# Patient Record
Sex: Male | Born: 2000 | Race: White | Hispanic: No | Marital: Single | Smoking: Never smoker
Health system: Southern US, Community
[De-identification: ages and names within clinical notes are randomized; demographics above are authoritative.]

## PROBLEM LIST (undated history)

## (undated) HISTORY — PX: OTHER SURGICAL HISTORY: SHX169

## (undated) HISTORY — PX: KNEE ARTHROCENTESIS: SUR44

---
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
## 2020-03-21 DIAGNOSIS — Y999 Unspecified external cause status: Secondary | ICD-10-CM | POA: Diagnosis not present

## 2020-03-21 DIAGNOSIS — Z20822 Contact with and (suspected) exposure to covid-19: Secondary | ICD-10-CM | POA: Diagnosis not present

## 2020-03-21 DIAGNOSIS — R6884 Jaw pain: Secondary | ICD-10-CM | POA: Diagnosis not present

## 2020-03-21 DIAGNOSIS — J020 Streptococcal pharyngitis: Secondary | ICD-10-CM | POA: Diagnosis not present

## 2020-03-21 DIAGNOSIS — S161XXA Strain of muscle, fascia and tendon at neck level, initial encounter: Secondary | ICD-10-CM | POA: Diagnosis not present

## 2020-03-21 DIAGNOSIS — Z041 Encounter for examination and observation following transport accident: Secondary | ICD-10-CM | POA: Diagnosis not present

## 2020-03-21 DIAGNOSIS — S0083XA Contusion of other part of head, initial encounter: Secondary | ICD-10-CM | POA: Diagnosis not present

## 2020-03-23 IMAGING — MR MR KNEE*L* W/O CM
6 series · 38 of 40 positions shown · non-contrast
Comparison: None.

Addendum:
CLINICAL DATA: Catching of the left knee with pain more so
laterally x6 weeks.

EXAM:
MRI OF THE LEFT KNEE WITHOUT CONTRAST
TECHNIQUE: Multiplanar, multisequence MR imaging of the knee was performed. No
intravenous contrast was administered.

[Series 6: T2 fat-sat · axial · left · 4.0mm · 0.50mm/px · z∈[-92,+61]mm · 8 of 36 slices shown (1 of 3)]
[im 1/36]
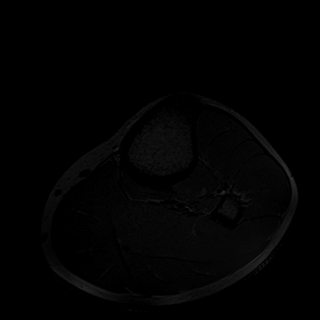
[im 6/36]
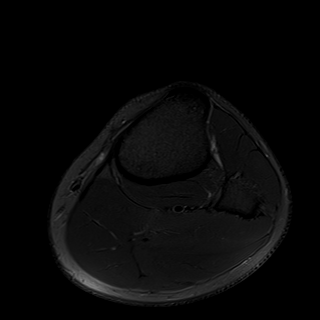
[im 11/36]
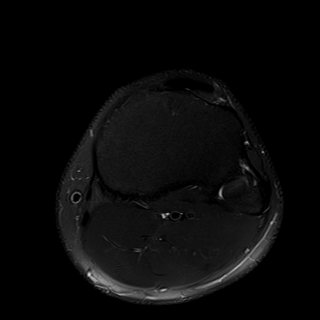
[im 16/36]
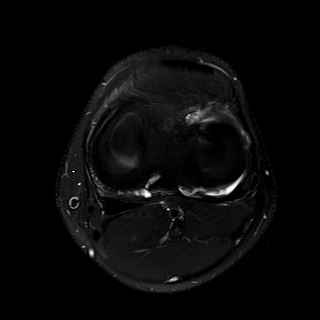
[im 21/36]
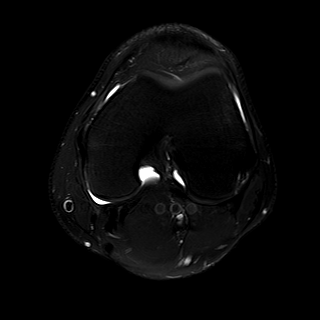
[im 26/36]
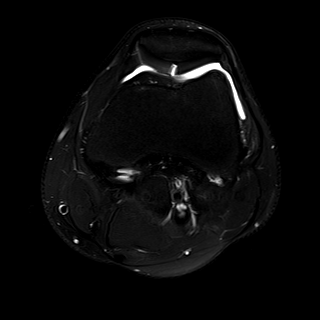
[im 31/36]
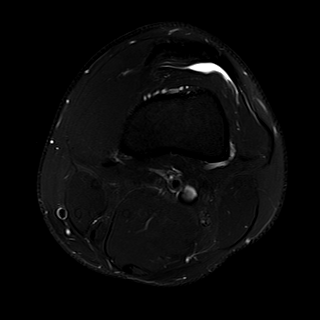
[im 36/36]
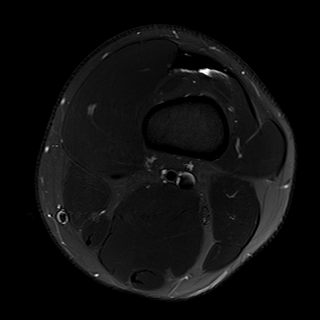

[Series 7: T2 fat-sat · coronal · left · 4.0mm · 0.39mm/px · 6 of 31 slices shown (2 of 3)]
[im 1/31]
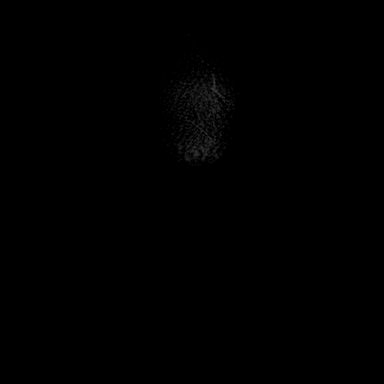
[im 7/31]
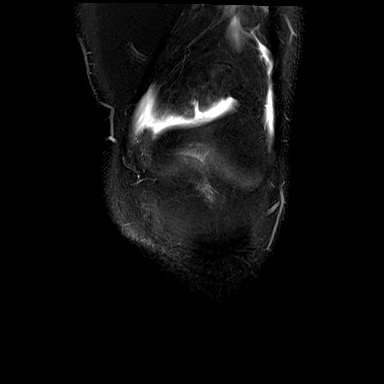
[im 13/31]
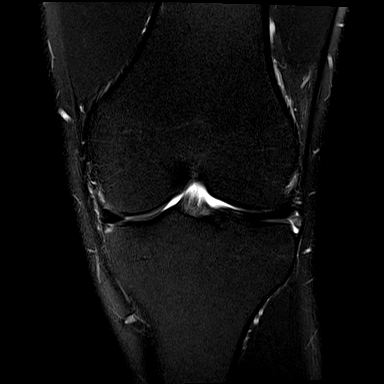
[im 19/31]
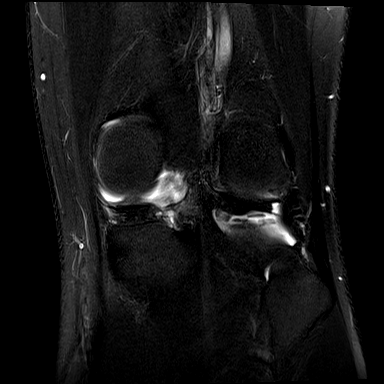
[im 25/31]
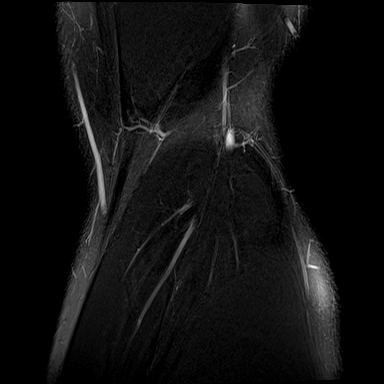
[im 31/31]
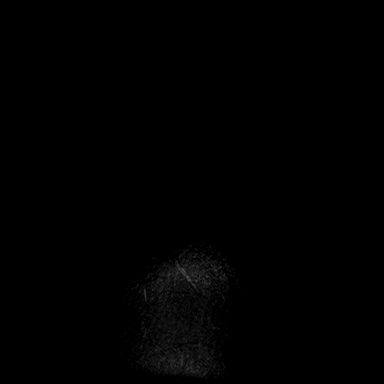

[Series 8: T1 · coronal · left · 4.0mm · 0.39mm/px · 4 of 31 slices shown]
[im 1/31]
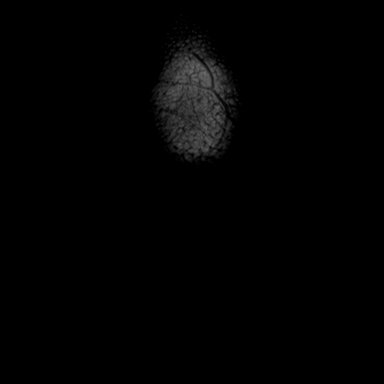
[im 7/31]
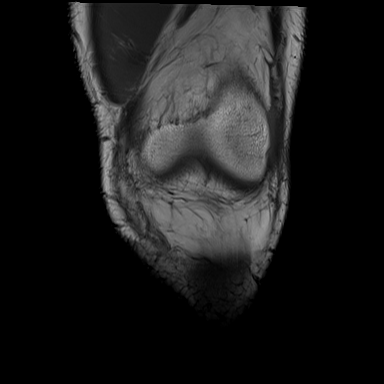
[im 13/31]
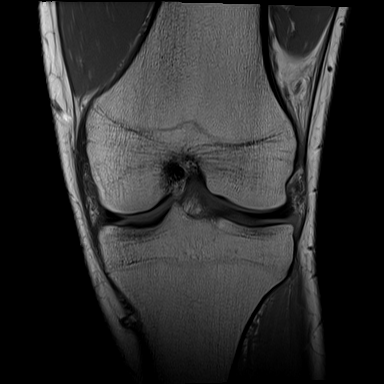
[im 19/31]
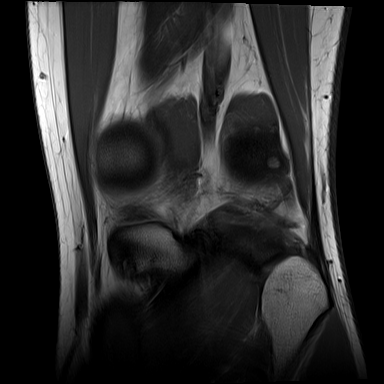

[Series 9: PD fat-sat · sagittal · left · 3.0mm · 0.39mm/px · 6 of 29 slices shown (1 of 2)]
[im 1/29]
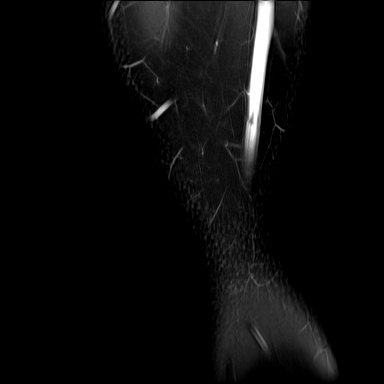
[im 6/29]
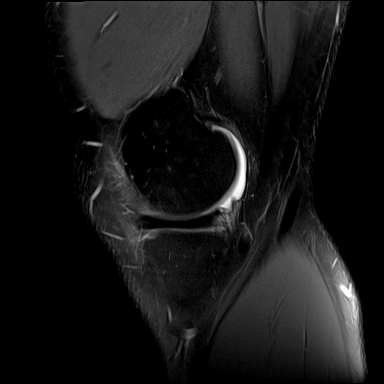
[im 12/29]
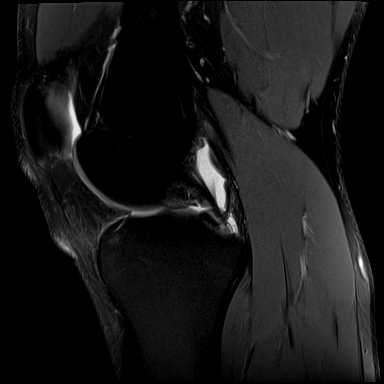
[im 17/29]
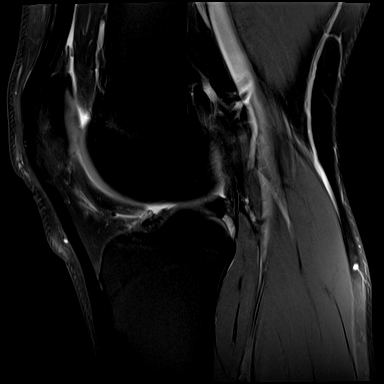
[im 23/29]
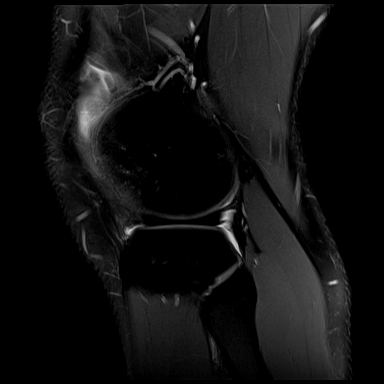
[im 29/29]
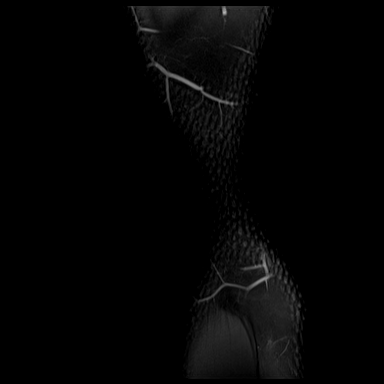

[Series 10: PD fat-sat · coronal · left · 3.0mm · 0.47mm/px · 8 of 39 slices shown (2 of 2)]
[im 1/39]
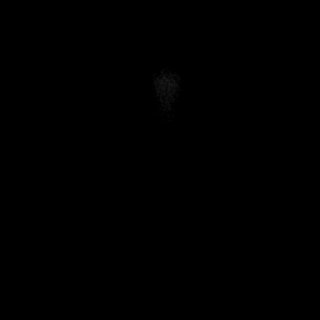
[im 6/39]
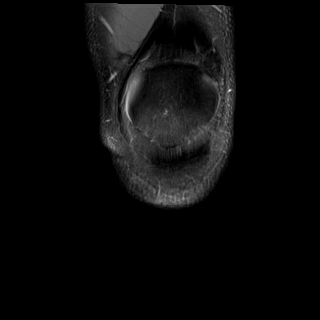
[im 11/39]
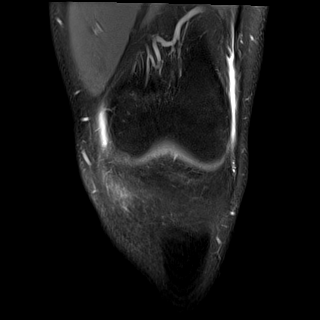
[im 17/39]
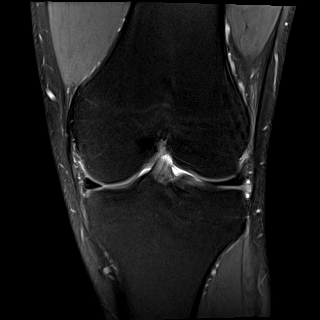
[im 22/39]
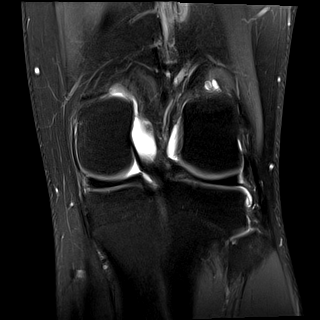
[im 28/39]
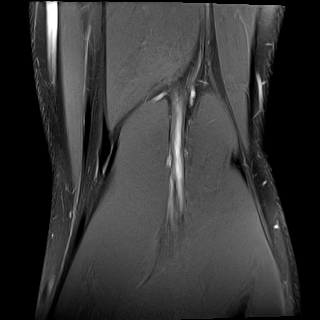
[im 33/39]
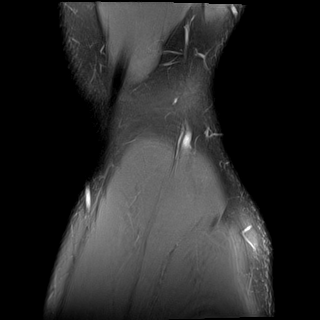
[im 39/39]
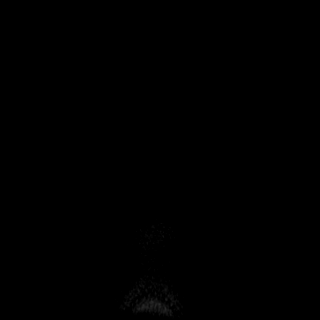

[Series 11: T2 fat-sat · sagittal · left · 3.0mm · 0.39mm/px · 6 of 29 slices shown (3 of 3)]
[im 1/29]
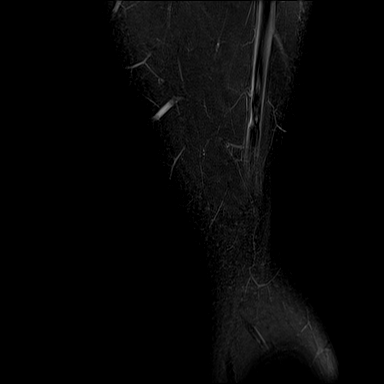
[im 6/29]
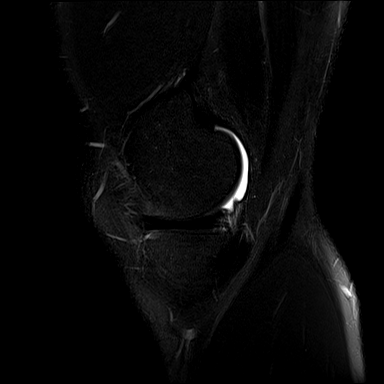
[im 12/29]
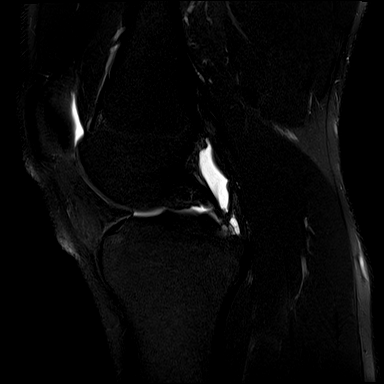
[im 17/29]
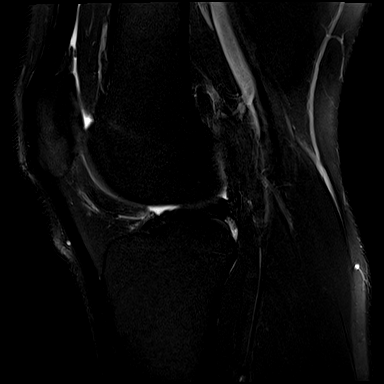
[im 23/29]
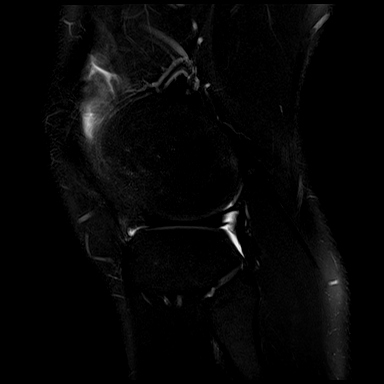
[im 29/29]
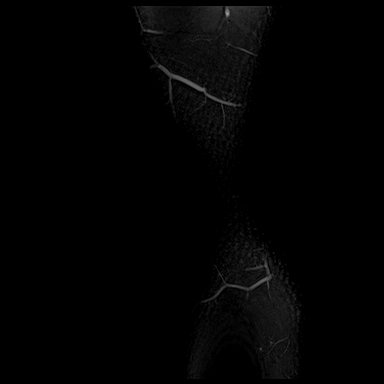

[38 of 40 positions shown; findings below may reference images not displayed]

FINDINGS: MENISCI

Medial meniscus:  Intact.

Lateral meniscus:  Intact.

LIGAMENTS

Cruciates:  Intact ACL and PCL.

Collaterals: Medial collateral ligament is intact. Lateral
collateral ligament complex is intact.

CARTILAGE

Patellofemoral: Full-thickness focal cartilaginous defect overlying
the median ridge of the patella, series [DATE] measuring 6 x 5 x 3 mm
in craniocaudad by AP by transverse.

Medial:  Intact

Lateral:  Intact

Joint: Trace joint effusion. Intact Hoffa's fat pad. No plical
thickening.

Popliteal Fossa:  No Baker cyst. Intact popliteus tendon.

Extensor Mechanism:  Intact quadriceps tendon and patellar tendon.

Bones: No focal marrow signal abnormality. No fracture or
dislocation.

Other: None
IMPRESSION: 1. Full-thickness cartilaginous defect of the patella overlying the
median ridge of the patella measuring 6 x 5 x 3 mm (CC x AP by
transverse).
2. Intact cruciate and collateral ligaments.
3. Intact menisci.

ADDENDUM:
The original report was by Dr. Mekere Bertelsen. The following addendum is
by Dr. Voss Sahl:

A request was received for assessment of the tibial
tubercle-trochlear groove distance.

The TT-TG distance is 0.6 cm. No abnormality of the patellar
retinacula or medial patellofemoral ligament identified.

*** End of Addendum ***

## 2020-03-29 DIAGNOSIS — Z1152 Encounter for screening for COVID-19: Secondary | ICD-10-CM | POA: Diagnosis not present

## 2020-03-29 DIAGNOSIS — Z20822 Contact with and (suspected) exposure to covid-19: Secondary | ICD-10-CM | POA: Diagnosis not present

## 2020-05-24 DIAGNOSIS — F4322 Adjustment disorder with anxiety: Secondary | ICD-10-CM | POA: Diagnosis not present

## 2020-10-03 DIAGNOSIS — H6641 Suppurative otitis media, unspecified, right ear: Secondary | ICD-10-CM | POA: Diagnosis not present

## 2020-10-03 DIAGNOSIS — Z7251 High risk heterosexual behavior: Secondary | ICD-10-CM | POA: Diagnosis not present

## 2020-10-03 DIAGNOSIS — J069 Acute upper respiratory infection, unspecified: Secondary | ICD-10-CM | POA: Diagnosis not present

## 2020-10-03 DIAGNOSIS — R634 Abnormal weight loss: Secondary | ICD-10-CM | POA: Diagnosis not present

## 2020-10-05 DIAGNOSIS — Z23 Encounter for immunization: Secondary | ICD-10-CM | POA: Diagnosis not present

## 2020-10-05 DIAGNOSIS — Z13220 Encounter for screening for lipoid disorders: Secondary | ICD-10-CM | POA: Diagnosis not present

## 2020-10-05 DIAGNOSIS — Z9189 Other specified personal risk factors, not elsewhere classified: Secondary | ICD-10-CM | POA: Diagnosis not present

## 2020-10-05 DIAGNOSIS — Z0000 Encounter for general adult medical examination without abnormal findings: Secondary | ICD-10-CM | POA: Diagnosis not present

## 2020-10-27 DIAGNOSIS — F4322 Adjustment disorder with anxiety: Secondary | ICD-10-CM | POA: Diagnosis not present

## 2022-01-30 DIAGNOSIS — D72829 Elevated white blood cell count, unspecified: Secondary | ICD-10-CM | POA: Diagnosis not present

## 2022-01-30 DIAGNOSIS — Z20822 Contact with and (suspected) exposure to covid-19: Secondary | ICD-10-CM | POA: Diagnosis not present

## 2022-01-30 DIAGNOSIS — R112 Nausea with vomiting, unspecified: Secondary | ICD-10-CM | POA: Diagnosis not present

## 2022-01-30 DIAGNOSIS — R109 Unspecified abdominal pain: Secondary | ICD-10-CM | POA: Diagnosis not present

## 2022-01-30 DIAGNOSIS — R1110 Vomiting, unspecified: Secondary | ICD-10-CM | POA: Diagnosis not present

## 2022-01-30 DIAGNOSIS — R197 Diarrhea, unspecified: Secondary | ICD-10-CM | POA: Diagnosis not present

## 2022-01-31 DIAGNOSIS — R112 Nausea with vomiting, unspecified: Secondary | ICD-10-CM | POA: Diagnosis not present

## 2022-01-31 DIAGNOSIS — F12988 Cannabis use, unspecified with other cannabis-induced disorder: Secondary | ICD-10-CM | POA: Diagnosis not present

## 2022-01-31 DIAGNOSIS — R109 Unspecified abdominal pain: Secondary | ICD-10-CM | POA: Diagnosis not present

## 2022-08-08 DIAGNOSIS — R1033 Periumbilical pain: Secondary | ICD-10-CM | POA: Diagnosis not present

## 2022-08-08 DIAGNOSIS — R10819 Abdominal tenderness, unspecified site: Secondary | ICD-10-CM | POA: Diagnosis not present

## 2022-08-08 DIAGNOSIS — R112 Nausea with vomiting, unspecified: Secondary | ICD-10-CM | POA: Diagnosis not present

## 2022-08-08 DIAGNOSIS — R7989 Other specified abnormal findings of blood chemistry: Secondary | ICD-10-CM | POA: Diagnosis not present

## 2022-08-08 DIAGNOSIS — R109 Unspecified abdominal pain: Secondary | ICD-10-CM | POA: Diagnosis not present

## 2022-08-08 DIAGNOSIS — R748 Abnormal levels of other serum enzymes: Secondary | ICD-10-CM | POA: Diagnosis not present

## 2022-08-08 DIAGNOSIS — F1290 Cannabis use, unspecified, uncomplicated: Secondary | ICD-10-CM | POA: Diagnosis not present

## 2022-08-08 DIAGNOSIS — F17200 Nicotine dependence, unspecified, uncomplicated: Secondary | ICD-10-CM | POA: Diagnosis not present

## 2022-08-09 DIAGNOSIS — R112 Nausea with vomiting, unspecified: Secondary | ICD-10-CM | POA: Diagnosis not present

## 2022-08-09 DIAGNOSIS — R7989 Other specified abnormal findings of blood chemistry: Secondary | ICD-10-CM | POA: Diagnosis not present

## 2022-08-14 DIAGNOSIS — D225 Melanocytic nevi of trunk: Secondary | ICD-10-CM | POA: Diagnosis not present

## 2022-08-14 DIAGNOSIS — Z7189 Other specified counseling: Secondary | ICD-10-CM | POA: Diagnosis not present

## 2022-08-14 DIAGNOSIS — D224 Melanocytic nevi of scalp and neck: Secondary | ICD-10-CM | POA: Diagnosis not present

## 2022-08-14 DIAGNOSIS — D492 Neoplasm of unspecified behavior of bone, soft tissue, and skin: Secondary | ICD-10-CM | POA: Diagnosis not present

## 2022-08-14 DIAGNOSIS — D229 Melanocytic nevi, unspecified: Secondary | ICD-10-CM | POA: Diagnosis not present

## 2023-01-15 DIAGNOSIS — R21 Rash and other nonspecific skin eruption: Secondary | ICD-10-CM | POA: Diagnosis not present

## 2023-08-15 ENCOUNTER — Encounter (HOSPITAL_BASED_OUTPATIENT_CLINIC_OR_DEPARTMENT_OTHER): Payer: Self-pay | Admitting: Orthopedic Surgery

## 2023-08-15 ENCOUNTER — Other Ambulatory Visit: Payer: Self-pay

## 2023-08-20 ENCOUNTER — Other Ambulatory Visit: Payer: Self-pay | Admitting: Orthopedic Surgery

## 2023-08-21 NOTE — H&P (Cosign Needed)
  Casey Harper is an 23 y.o. male.   Chief Complaint: Right forefoot pain HPI: Patient is a 23 year old male that presents to the OR today for definitive treatment for his painful right bunion deformity.  He has failed conservative treatment consisting of footwear modification and activity modification.  He elects for surgical correction of his painful deformity.  Allergies: No Known Allergies  History reviewed. No pertinent past medical history.  Past Surgical History:  Procedure Laterality Date   OTHER SURGICAL HISTORY     "Thumb surgery" by Dr. Amanda Pea    Family History: History reviewed. No pertinent family history.  Social History:   reports that he has never smoked. He has never used smokeless tobacco. He reports current alcohol use. He reports that he does not use drugs.  Medications: No medications prior to admission.    No results found for this or any previous visit (from the past 48 hours).  No results found.    Height 5\' 11"  (1.803 m), weight 77.1 kg.  PE:  well nourished and well developed.  NAD.  EOMI.  Resp unlabored.  Right foot bunion deformity.  Tender to palpation at median hypertrophic eminence.  Assessment/Plan Right bunion  Patient presents to the OR for definitive treatment of his painful right bunion deformity.  He will require right MIS bunion correction with double osteotomy.  After reviewing the procedure, postoperative protocol, and risk of surgery the patient elects for surgical intervention.  The patient specifically understands risks of bleeding, infection, nerve damage, blood clots, need for additional surgery, continued pain, nonunion, post traumatic arthritis, recurrence of deformity, amputation and death.   Alfredo Martinez PA-C [facility] Office:  [phone]   The patient has a h/o right foot painful bunion deformity that limits his activities and shoewear.  He presents today for surgical correction of this painful condition.  The risks  and benefits of the alternative treatment options have been discussed in detail.  The patient wishes to proceed with surgery and specifically understands risks of bleeding, infection, nerve damage, blood clots, need for additional surgery, amputation and death.

## 2023-08-22 ENCOUNTER — Other Ambulatory Visit: Payer: Self-pay

## 2023-08-22 ENCOUNTER — Encounter (HOSPITAL_BASED_OUTPATIENT_CLINIC_OR_DEPARTMENT_OTHER): Payer: Self-pay | Admitting: Orthopedic Surgery

## 2023-08-22 ENCOUNTER — Ambulatory Visit (HOSPITAL_BASED_OUTPATIENT_CLINIC_OR_DEPARTMENT_OTHER)

## 2023-08-22 ENCOUNTER — Ambulatory Visit (HOSPITAL_BASED_OUTPATIENT_CLINIC_OR_DEPARTMENT_OTHER)
Admission: RE | Admit: 2023-08-22 | Discharge: 2023-08-22 | Disposition: A | Discharge status: Home / Self Care | Payer: BLUE CROSS/BLUE SHIELD | Attending: Orthopedic Surgery | Admitting: Orthopedic Surgery

## 2023-08-22 ENCOUNTER — Ambulatory Visit (HOSPITAL_BASED_OUTPATIENT_CLINIC_OR_DEPARTMENT_OTHER): Admitting: Anesthesiology

## 2023-08-22 ENCOUNTER — Encounter (HOSPITAL_BASED_OUTPATIENT_CLINIC_OR_DEPARTMENT_OTHER)
Admission: RE | Disposition: A | Discharge status: Home / Self Care | Payer: Self-pay | Source: Home / Self Care | Attending: Orthopedic Surgery

## 2023-08-22 DIAGNOSIS — Z01818 Encounter for other preprocedural examination: Secondary | ICD-10-CM

## 2023-08-22 DIAGNOSIS — M21611 Bunion of right foot: Secondary | ICD-10-CM | POA: Diagnosis present

## 2023-08-22 HISTORY — PX: BUNIONECTOMY: SHX129

## 2023-08-22 SURGERY — BUNIONECTOMY
Anesthesia: General | Site: Foot | Laterality: Right

## 2023-08-22 MED ORDER — DEXAMETHASONE SODIUM PHOSPHATE 4 MG/ML IJ SOLN
INTRAMUSCULAR | Status: DC | PRN
  Administered 2023-08-22: 5 mg via INTRAVENOUS

## 2023-08-22 MED ORDER — FENTANYL CITRATE (PF) 100 MCG/2ML IJ SOLN
INTRAMUSCULAR | Status: AC
  Filled 2023-08-22: qty 2

## 2023-08-22 MED ORDER — OXYCODONE HCL 5 MG/5ML PO SOLN: 5.0000 mg | Freq: Once | ORAL | Status: DC | PRN

## 2023-08-22 MED ORDER — CEFAZOLIN SODIUM-DEXTROSE 2-4 GM/100ML-% IV SOLN
2.0000 g | INTRAVENOUS | Status: AC
  Administered 2023-08-22: 2 g via INTRAVENOUS

## 2023-08-22 MED ORDER — PROPOFOL 500 MG/50ML IV EMUL
INTRAVENOUS | Status: AC
  Filled 2023-08-22: qty 50

## 2023-08-22 MED ORDER — SENNA 8.6 MG PO TABS: 2.0000 | ORAL_TABLET | Freq: Two times a day (BID) | ORAL | 0 refills | Status: AC

## 2023-08-22 MED ORDER — FENTANYL CITRATE (PF) 100 MCG/2ML IJ SOLN: 25.0000 ug | INTRAMUSCULAR | Status: DC | PRN

## 2023-08-22 MED ORDER — CEFAZOLIN SODIUM-DEXTROSE 2-4 GM/100ML-% IV SOLN
INTRAVENOUS | Status: AC
  Filled 2023-08-22: qty 100

## 2023-08-22 MED ORDER — ACETAMINOPHEN 10 MG/ML IV SOLN: 1000.0000 mg | Freq: Once | INTRAVENOUS | Status: DC | PRN

## 2023-08-22 MED ORDER — PROPOFOL 500 MG/50ML IV EMUL
INTRAVENOUS | Status: DC | PRN
  Administered 2023-08-22: 50 ug/kg/min via INTRAVENOUS

## 2023-08-22 MED ORDER — ONDANSETRON HCL 4 MG/2ML IJ SOLN
4.0000 mg | Freq: Once | INTRAMUSCULAR | Status: AC | PRN
  Administered 2023-08-22: 4 mg via INTRAVENOUS

## 2023-08-22 MED ORDER — DEXAMETHASONE SODIUM PHOSPHATE 10 MG/ML IJ SOLN
INTRAMUSCULAR | Status: DC | PRN
  Administered 2023-08-22: 10 mg via INTRAVENOUS

## 2023-08-22 MED ORDER — FENTANYL CITRATE (PF) 100 MCG/2ML IJ SOLN
100.0000 ug | Freq: Once | INTRAMUSCULAR | Status: AC
  Administered 2023-08-22: 100 ug via INTRAVENOUS

## 2023-08-22 MED ORDER — BUPIVACAINE-EPINEPHRINE (PF) 0.5% -1:200000 IJ SOLN
INTRAMUSCULAR | Status: AC
  Filled 2023-08-22: qty 30

## 2023-08-22 MED ORDER — ROPIVACAINE HCL 5 MG/ML IJ SOLN
INTRAMUSCULAR | Status: DC | PRN
  Administered 2023-08-22: 30 mL via PERINEURAL

## 2023-08-22 MED ORDER — DOCUSATE SODIUM 100 MG PO CAPS: 100.0000 mg | ORAL_CAPSULE | Freq: Two times a day (BID) | ORAL | 0 refills | Status: AC

## 2023-08-22 MED ORDER — LACTATED RINGERS IV SOLN: INTRAVENOUS | Status: DC

## 2023-08-22 MED ORDER — SODIUM CHLORIDE 0.9 % IV SOLN
INTRAVENOUS | Status: AC | PRN
Start: 2023-08-22 — End: 2023-08-22
  Administered 2023-08-22: 100 mL

## 2023-08-22 MED ORDER — DEXAMETHASONE SODIUM PHOSPHATE 10 MG/ML IJ SOLN
INTRAMUSCULAR | Status: AC
  Filled 2023-08-22: qty 1

## 2023-08-22 MED ORDER — MIDAZOLAM HCL 2 MG/2ML IJ SOLN
INTRAMUSCULAR | Status: AC
  Filled 2023-08-22: qty 2

## 2023-08-22 MED ORDER — ONDANSETRON HCL 4 MG/2ML IJ SOLN
INTRAMUSCULAR | Status: AC
  Filled 2023-08-22: qty 2

## 2023-08-22 MED ORDER — OXYCODONE HCL 5 MG PO TABS: 5.0000 mg | ORAL_TABLET | Freq: Once | ORAL | Status: DC | PRN

## 2023-08-22 MED ORDER — OXYCODONE HCL 5 MG PO TABS: 5.0000 mg | ORAL_TABLET | Freq: Four times a day (QID) | ORAL | 0 refills | Status: AC | PRN

## 2023-08-22 MED ORDER — PROPOFOL 10 MG/ML IV BOLUS
INTRAVENOUS | Status: DC | PRN
  Administered 2023-08-22: 200 mg via INTRAVENOUS

## 2023-08-22 MED ORDER — MIDAZOLAM HCL 2 MG/2ML IJ SOLN
2.0000 mg | Freq: Once | INTRAMUSCULAR | Status: AC
  Administered 2023-08-22: 2 mg via INTRAVENOUS

## 2023-08-22 MED ORDER — LIDOCAINE 2% (20 MG/ML) 5 ML SYRINGE
INTRAMUSCULAR | Status: DC | PRN
  Administered 2023-08-22: 20 mg via INTRAVENOUS

## 2023-08-22 MED ORDER — LIDOCAINE 2% (20 MG/ML) 5 ML SYRINGE
INTRAMUSCULAR | Status: AC
  Filled 2023-08-22: qty 5

## 2023-08-22 MED ORDER — VANCOMYCIN HCL 500 MG IV SOLR
INTRAVENOUS | Status: AC
  Filled 2023-08-22: qty 10

## 2023-08-22 MED ORDER — PROPOFOL 10 MG/ML IV BOLUS
INTRAVENOUS | Status: AC
  Filled 2023-08-22: qty 20

## 2023-08-22 MED ORDER — 0.9 % SODIUM CHLORIDE (POUR BTL) OPTIME
TOPICAL | Status: DC | PRN
  Administered 2023-08-22: 1000 mL

## 2023-08-22 MED ORDER — CLONIDINE HCL (ANALGESIA) 100 MCG/ML EP SOLN
EPIDURAL | Status: DC | PRN
  Administered 2023-08-22: 100 ug

## 2023-08-22 SURGICAL SUPPLY — 80 items
BANDAGE ESMARK 6X9 LF (GAUZE/BANDAGES/DRESSINGS) IMPLANT
BIT DRILL 2 STRT CANN (BIT) IMPLANT
BIT DRILL CANN 2.9 (BIT) IMPLANT
BLADE AVERAGE 25X9 (BLADE) IMPLANT
BLADE LONG MED 25X9 (BLADE) IMPLANT
BLADE MICRO SAGITTAL (BLADE) IMPLANT
BLADE MINI RND TIP GREEN BEAV (BLADE) ×1 IMPLANT
BLADE OSC/SAG .038X5.5 CUT EDG (BLADE) IMPLANT
BLADE SURG 15 STRL LF DISP TIS (BLADE) ×2 IMPLANT
BNDG COHESIVE 4X5 WHT NS (GAUZE/BANDAGES/DRESSINGS) IMPLANT
BNDG ELASTIC 4INX 5YD STR LF (GAUZE/BANDAGES/DRESSINGS) ×1 IMPLANT
BNDG ELASTIC 6INX 5YD STR LF (GAUZE/BANDAGES/DRESSINGS) IMPLANT
BNDG ESMARK 6X9 LF (GAUZE/BANDAGES/DRESSINGS) IMPLANT
BNDG STRETCH GAUZE 3IN X12FT (GAUZE/BANDAGES/DRESSINGS) ×1 IMPLANT
BUR MIS CONICAL WEDGE 4.3X13 (BURR) IMPLANT
BUR MIS STRT 2.0X19.5 (BURR) IMPLANT
BURR MIS CONICAL WEDGE 4.3X13 (BURR) IMPLANT
BURR MIS STRT 2.0X19.5 (BURR) ×1 IMPLANT
CHLORAPREP W/TINT 26 (MISCELLANEOUS) ×1 IMPLANT
COVER BACK TABLE 60X90IN (DRAPES) ×1 IMPLANT
CUFF TRNQT CYL 24X4X16.5-23 (TOURNIQUET CUFF) IMPLANT
CUFF TRNQT CYL 34X4.125X (TOURNIQUET CUFF) IMPLANT
DRAPE EXTREMITY T 121X128X90 (DISPOSABLE) ×1 IMPLANT
DRAPE INCISE IOBAN 66X45 STRL (DRAPES) IMPLANT
DRAPE OEC MINIVIEW 54X84 (DRAPES) ×1 IMPLANT
DRAPE U-SHAPE 47X51 STRL (DRAPES) ×1 IMPLANT
DRESSING MEPILEX FLEX 4X4 (GAUZE/BANDAGES/DRESSINGS) IMPLANT
DRSG MEPILEX FLEX 4X4 (GAUZE/BANDAGES/DRESSINGS) IMPLANT
DRSG MEPITEL 4X7.2 (GAUZE/BANDAGES/DRESSINGS) ×1 IMPLANT
ELECT REM PT RETURN 9FT ADLT (ELECTROSURGICAL) ×1 IMPLANT
ELECTRODE REM PT RTRN 9FT ADLT (ELECTROSURGICAL) ×1 IMPLANT
GAUZE PAD ABD 8X10 STRL (GAUZE/BANDAGES/DRESSINGS) ×1 IMPLANT
GAUZE SPONGE 4X4 12PLY STRL (GAUZE/BANDAGES/DRESSINGS) ×1 IMPLANT
GAUZE STRETCH 2X75IN STRL (MISCELLANEOUS) IMPLANT
GLOVE BIO SURGEON STRL SZ 6.5 (GLOVE) IMPLANT
GLOVE BIO SURGEON STRL SZ8 (GLOVE) ×1 IMPLANT
GLOVE BIOGEL PI IND STRL 7.0 (GLOVE) IMPLANT
GLOVE BIOGEL PI IND STRL 8 (GLOVE) ×2 IMPLANT
GLOVE ECLIPSE 8.0 STRL XLNG CF (GLOVE) ×1 IMPLANT
GLOVE INDICATOR 7.5 STRL GRN (GLOVE) IMPLANT
GLOVE SURG SS PI 7.0 STRL IVOR (GLOVE) IMPLANT
GOWN STRL REUS W/ TWL LRG LVL3 (GOWN DISPOSABLE) ×1 IMPLANT
GOWN STRL REUS W/ TWL XL LVL3 (GOWN DISPOSABLE) ×2 IMPLANT
GUIDEWIRE 0.86MM (WIRE) IMPLANT
GUIDEWIRE BEVELED FT 1.4X3.5 (WIRE) IMPLANT
K-WIRE DBL .054X9 NSTRL (WIRE) IMPLANT
KWIRE DBL .054X9 NSTRL (WIRE) ×1 IMPLANT
NDL HYPO 22X1.5 SAFETY MO (MISCELLANEOUS) IMPLANT
NDL HYPO 25X1 1.5 SAFETY (NEEDLE) IMPLANT
NEEDLE HYPO 22X1.5 SAFETY MO (MISCELLANEOUS) IMPLANT
NEEDLE HYPO 25X1 1.5 SAFETY (NEEDLE) IMPLANT
NS IRRIG 1000ML POUR BTL (IV SOLUTION) ×1 IMPLANT
PACK BASIN DAY SURGERY FS (CUSTOM PROCEDURE TRAY) ×1 IMPLANT
PAD CAST 4YDX4 CTTN HI CHSV (CAST SUPPLIES) ×1 IMPLANT
PADDING CAST ABS COTTON 4X4 ST (CAST SUPPLIES) IMPLANT
PADDING CAST COTTON 6X4 STRL (CAST SUPPLIES) IMPLANT
PENCIL SMOKE EVACUATOR (MISCELLANEOUS) ×1 IMPLANT
SANITIZER HAND PURELL FF 515ML (MISCELLANEOUS) ×1 IMPLANT
SCREW BEVELED 3.5X48 (Screw) IMPLANT
SCREW BEVELED 3.5X48MM (Screw) ×1 IMPLANT
SCREW BEVELED 3.5X60 (Screw) IMPLANT
SCREW CANN COMP FT 2.5X22 (Screw) IMPLANT
SET IRRIGATION TUBING (TUBING) IMPLANT
SHEET MEDIUM DRAPE 40X70 STRL (DRAPES) ×1 IMPLANT
SLEEVE SCD COMPRESS KNEE MED (STOCKING) ×1 IMPLANT
SPLINT PLASTER CAST FAST 5X30 (CAST SUPPLIES) IMPLANT
SPONGE T-LAP 18X18 ~~LOC~~+RFID (SPONGE) ×1 IMPLANT
STOCKINETTE 6 STRL (DRAPES) ×1 IMPLANT
SUCTION TUBE FRAZIER 10FR DISP (SUCTIONS) ×1 IMPLANT
SUT ETHILON 3 0 PS 1 (SUTURE) ×1 IMPLANT
SUT MNCRL AB 3-0 PS2 18 (SUTURE) ×1 IMPLANT
SUT VIC AB 2-0 SH 27XBRD (SUTURE) IMPLANT
SUT VICRYL 0 SH 27 (SUTURE) IMPLANT
SUT VICRYL 0 UR6 27IN ABS (SUTURE) IMPLANT
SYR BULB EAR ULCER 3OZ GRN STR (SYRINGE) ×1 IMPLANT
SYR CONTROL 10ML LL (SYRINGE) IMPLANT
TOWEL GREEN STERILE FF (TOWEL DISPOSABLE) ×2 IMPLANT
TUBE CONNECTING 20X1/4 (TUBING) IMPLANT
UNDERPAD 30X36 HEAVY ABSORB (UNDERPADS AND DIAPERS) ×1 IMPLANT
YANKAUER SUCT BULB TIP NO VENT (SUCTIONS) IMPLANT

## 2023-08-22 NOTE — Anesthesia Procedure Notes (Signed)
 Procedure Name: LMA Insertion Date/Time: 08/22/2023 7:35 AM  Performed by: Thornell Mule, CRNAPre-anesthesia Checklist: Patient identified, Emergency Drugs available, Suction available and Patient being monitored Patient Re-evaluated:Patient Re-evaluated prior to induction Oxygen Delivery Method: Circle system utilized Preoxygenation: Pre-oxygenation with 100% oxygen Induction Type: IV induction LMA: LMA inserted LMA Size: 4.0 Number of attempts: 1 Placement Confirmation: positive ETCO2 Tube secured with: Tape Dental Injury: Teeth and Oropharynx as per pre-operative assessment

## 2023-08-22 NOTE — Progress Notes (Signed)
 Assisted Dr. Ace Gins with right, adductor canal, popliteal, ultrasound guided block. Side rails up, monitors on throughout procedure. See vital signs in flow sheet. Tolerated Procedure well.

## 2023-08-22 NOTE — Discharge Instructions (Addendum)
 Toni Arthurs, MD [facility]  Please read the following information regarding your care after surgery.  Medications  You only need a prescription for the narcotic pain medicine (ex. oxycodone, Percocet, Norco).  All of the other medicines listed below are available over the counter. ? Aleve 2 pills twice a day for the first 3 days after surgery. ? acetominophen (Tylenol) 650 mg every 4-6 hours as you need for minor to moderate pain ? oxycodone as prescribed for severe pain  Narcotic pain medicine (ex. oxycodone, Percocet, Vicodin) will cause constipation.  To prevent this problem, take the following medicines while you are taking any pain medicine. ? docusate sodium (Colace) 100 mg twice a day ? senna (Senokot) 2 tablets twice a day  Weight Bearing ? Bear weight only on your operated foot in the post-op shoe.   Cast / Splint / Dressing ? Keep your splint, cast or dressing clean and dry.  Don't put anything (coat hanger, pencil, etc) down inside of it.  If it gets damp, use a hair dryer on the cool setting to dry it.  If it gets soaked, call the office to schedule an appointment for a cast change.   After your dressing, cast or splint is removed; you may shower, but do not soak or scrub the wound.  Allow the water to run over it, and then gently pat it dry.  Swelling It is normal for you to have swelling where you had surgery.  To reduce swelling and pain, keep your toes above your nose for at least 3 days after surgery.  It may be necessary to keep your foot or leg elevated for several weeks.  If it hurts, it should be elevated.  Follow Up Call my office at [phone] when you are discharged from the hospital or surgery center to schedule an appointment to be seen two weeks after surgery.  Call my office at [phone] if you develop a fever >101.5 F, nausea, vomiting, bleeding from the surgical site or severe pain.     Post Anesthesia Home Care Instructions  Activity: Get  plenty of rest for the remainder of the day. A responsible individual must stay with you for 24 hours following the procedure.  For the next 24 hours, DO NOT: -Drive a car -Advertising copywriter -Drink alcoholic beverages -Take any medication unless instructed by your physician -Make any legal decisions or sign important papers.  Meals: Start with liquid foods such as gelatin or soup. Progress to regular foods as tolerated. Avoid greasy, spicy, heavy foods. If nausea and/or vomiting occur, drink only clear liquids until the nausea and/or vomiting subsides. Call your physician if vomiting continues.  Special Instructions/Symptoms: Your throat may feel dry or sore from the anesthesia or the breathing tube placed in your throat during surgery. If this causes discomfort, gargle with warm salt water. The discomfort should disappear within 24 hours.  If you had a scopolamine patch placed behind your ear for the management of post- operative nausea and/or vomiting:  1. The medication in the patch is effective for 72 hours, after which it should be removed.  Wrap patch in a tissue and discard in the trash. Wash hands thoroughly with soap and water. 2. You may remove the patch earlier than 72 hours if you experience unpleasant side effects which may include dry mouth, dizziness or visual disturbances. 3. Avoid touching the patch. Wash your hands with soap and water after contact with the patch.   Regional Anesthesia Blocks  1. You  may not be able to move or feel the "blocked" extremity after a regional anesthetic block. This may last may last from 3-48 hours after placement, but it will go away. The length of time depends on the medication injected and your individual response to the medication. As the nerves start to wake up, you may experience tingling as the movement and feeling returns to your extremity. If the numbness and inability to move your extremity has not gone away after 48 hours, please call  your surgeon.   2. The extremity that is blocked will need to be protected until the numbness is gone and the strength has returned. Because you cannot feel it, you will need to take extra care to avoid injury. Because it may be weak, you may have difficulty moving it or using it. You may not know what position it is in without looking at it while the block is in effect.  3. For blocks in the legs and feet, returning to weight bearing and walking needs to be done carefully. You will need to wait until the numbness is entirely gone and the strength has returned. You should be able to move your leg and foot normally before you try and bear weight or walk. You will need someone to be with you when you first try to ensure you do not fall and possibly risk injury.  4. Bruising and tenderness at the needle site are common side effects and will resolve in a few days.  5. Persistent numbness or new problems with movement should be communicated to the surgeon or the [facility] [phone] [facility] [phone]).

## 2023-08-22 NOTE — Anesthesia Postprocedure Evaluation (Signed)
 Anesthesia Post Note  Patient: Casey Harper  Procedure(s) Performed: RIGHT BUNION CORRECTION WITH DOUBLE OSTEOTOMY (Right: Foot)     Patient location during evaluation: PACU Anesthesia Type: General and Regional Level of consciousness: awake and alert Pain management: pain level controlled Vital Signs Assessment: post-procedure vital signs reviewed and stable Respiratory status: spontaneous breathing, nonlabored ventilation, respiratory function stable and patient connected to nasal cannula oxygen Cardiovascular status: blood pressure returned to baseline and stable Postop Assessment: no apparent nausea or vomiting Anesthetic complications: no   No notable events documented.  Last Vitals:  Vitals:   08/22/23 0932 08/22/23 0951  BP: (!) 109/50 123/62  Pulse: (!) 55 75  Resp: (!) 9 18  Temp:  36.8 C  SpO2: 99% 99%    Last Pain:  Vitals:   08/22/23 0951  TempSrc: Temporal  PainSc: 0-No pain                 Mariann Barter

## 2023-08-22 NOTE — Op Note (Signed)
 08/22/2023  8:44 AM  PATIENT:  Casey Harper  23 y.o. male  PRE-OPERATIVE DIAGNOSIS: Painful right foot bunion deformity  POST-OPERATIVE DIAGNOSIS: Same  Procedure(s): 1.  Right foot bunion correction with double osteotomy (first metatarsal and hallux proximal phalanx) 2.  Right foot AP and lateral radiographs  SURGEON:  Toni Arthurs, MD  ASSISTANT: Alfredo Martinez, PA-C  ANESTHESIA:   General, regional  EBL: 10 cc  TOURNIQUET: None  COMPLICATIONS:  None apparent  DISPOSITION:  Extubated, awake and stable to recovery.  INDICATION FOR PROCEDURE: 23 year old male without significant past medical history complains of worsening pain from a right foot bunion deformity.  He has struggled with activities and shoewear.  He presents now for surgical treatment of this painful and limiting right forefoot condition. The risks and benefits of the alternative treatment options have been discussed in detail.  The patient wishes to proceed with surgery and specifically understands risks of bleeding, infection, nerve damage, blood clots, need for additional surgery, amputation and death.   PROCEDURE IN DETAIL:  After pre operative consent was obtained, and the correct operative site was identified, the patient was brought to the operating room and placed supine on the OR table.  Anesthesia was administered.  Pre-operative antibiotics were administered.  A surgical timeout was taken.  The right lower extremity was prepped and draped in standard sterile fashion.  A K wire was inserted percutaneously from the medial base of the first metatarsal obliquely across the medullary canal to the lateral cortex proximal from the sesamoids.  AP and lateral radiographs confirmed appropriate position of the guidepin.  The parallel pin guide was used to insert a second K wire.  It was inserted parallel to the first and confirmed with AP and lateral radiographs.  Both K wires were pulled back out of the way of the osteotomy.   An incision was made medial to the metatarsal neck just proximal from the sesamoids.  Subperiosteal dissection was carried dorsal and plantar to the metatarsal.  A 2 x 19.5 mm Shannon bur was used to create an osteotomy under fluoroscopic guidance.  The Arthrex head pusher was inserted and used to translate the metatarsal head laterally.  Once appropriate correction was achieved the K wires were advanced into the metatarsal head to the level of the subchondral bone.  AP and lateral radiographs confirmed appropriate position of the K wires.  The more lateral K wire was overdrilled.  A 3.5 mm Arthrex beveled screw was inserted and seated deep to the subchondral bone.  A second 3.5 mm K wire was inserted over the more medial pin after predrilling.  Radiographs confirmed appropriate position of both screws.  The K wires were removed.  Attention was turned to the hallux proximal phalanx.  A small incision was made adjacent to the metaphysis.  Subperiosteal dissection was carried dorsal and plantar to the bone.  The bur was then inserted and used to make a closing wedge medial osteotomy the osteotomy was closed.  A guidepin for a 2.5 mm Arthrex headless compression screw was inserted obliquely crossing the osteotomy site and engaging the far cortex.  Radiographs confirmed appropriate position of the pin.  It was overdrilled.  A 2.5 millimeter screw was inserted and was noted to compress the osteotomy site appropriately.  It was noted to have excellent compression.  The overhanging bone of the medial cortex at the first metatarsal osteotomy site was resected with the bur after elevating the periosteum dorsally and plantarly.  The bone  fragment was removed from the distal incision.  Final AP and lateral radiographs confirmed appropriate position and length of all hardware and appropriate correction of the bunion deformity.  The wounds were irrigated copiously and closed with 3-0 nylon simple sutures.  Sterile  dressings were applied followed by a bunion wrap.  The patient was awakened from anesthesia and transported to the recovery room in stable condition.   FOLLOW UP PLAN: Weightbearing as tolerated on the heel in a Darco shoe.  Follow-up in 2 weeks for suture removal and conversion to a flat postop shoe.  No indication for DVT prophylaxis in this ambulatory patient.   RADIOGRAPHS: AP and lateral radiographs of the right foot are obtained intraoperatively.  These show interval correction of hallux valgus and intermetatarsal angles.  Hardware is appropriately positioned and of the appropriate lengths after first metatarsal and hallux proximal phalanx osteotomies.  No other acute injuries are noted.    Alfredo Martinez PA-C was present and scrubbed for the duration of the operative case. His assistance was essential in positioning the patient, prepping and draping, gaining and maintaining exposure, performing the operation, closing and dressing the wounds and applying the splint.

## 2023-08-22 NOTE — Anesthesia Procedure Notes (Signed)
 Anesthesia Regional Block: Adductor canal block   Pre-Anesthetic Checklist: , timeout performed,  Correct Patient, Correct Site, Correct Laterality,  Correct Procedure, Correct Position, site marked,  Risks and benefits discussed,  Surgical consent,  Pre-op evaluation,  At surgeon's request and post-op pain management  Laterality: Right  Prep: Maximum Sterile Barrier Precautions used, chloraprep       Needles:  Injection technique: Single-shot  Needle Type: Echogenic Needle      Needle Gauge: 20     Additional Needles:   Procedures:,,,, ultrasound used (permanent image in chart),,    Narrative:  Start time: 08/22/2023 7:15 AM End time: 08/22/2023 7:18 AM Injection made incrementally with aspirations every 5 mL.  Performed by: Personally  Anesthesiologist: Mariann Barter, MD

## 2023-08-22 NOTE — Anesthesia Procedure Notes (Signed)
 Anesthesia Regional Block: Popliteal block   Pre-Anesthetic Checklist: , timeout performed,  Correct Patient, Correct Site, Correct Laterality,  Correct Procedure, Correct Position, site marked,  Risks and benefits discussed,  Surgical consent,  Pre-op evaluation,  At surgeon's request and post-op pain management  Laterality: Right  Prep: Maximum Sterile Barrier Precautions used, chloraprep       Needles:  Injection technique: Single-shot  Needle Type: Echogenic Needle      Needle Gauge: 20     Additional Needles:   Procedures:,,,, ultrasound used (permanent image in chart),,    Narrative:  Start time: 08/22/2023 7:08 AM End time: 08/22/2023 7:13 AM Injection made incrementally with aspirations every 5 mL.  Performed by: Personally  Anesthesiologist: Mariann Barter, MD

## 2023-08-22 NOTE — Anesthesia Preprocedure Evaluation (Signed)
 Anesthesia Evaluation  Patient identified by MRN, date of birth, ID band Patient awake    Reviewed: Allergy & Precautions, NPO status , Patient's Chart, lab work & pertinent test results, reviewed documented beta blocker date and time   History of Anesthesia Complications Negative for: history of anesthetic complications  Airway Mallampati: II  TM Distance: >3 FB     Dental no notable dental hx.    Pulmonary neg COPD, Patient abstained from smoking., neg PE   breath sounds clear to auscultation       Cardiovascular (-) hypertension(-) CAD, (-) Past MI, (-) Cardiac Stents and (-) CABG  Rhythm:Regular Rate:Normal     Neuro/Psych neg Seizures    GI/Hepatic ,neg GERD  ,,(+) neg Cirrhosis        Endo/Other  neg diabetes    Renal/GU Renal disease     Musculoskeletal   Abdominal   Peds  Hematology   Anesthesia Other Findings   Reproductive/Obstetrics                             Anesthesia Physical Anesthesia Plan  ASA: 1  Anesthesia Plan: General   Post-op Pain Management: Regional block*   Induction: Intravenous  PONV Risk Score and Plan: 2 and Ondansetron and Dexamethasone  Airway Management Planned: LMA  Additional Equipment:   Intra-op Plan:   Post-operative Plan: Extubation in OR  Informed Consent: I have reviewed the patients History and Physical, chart, labs and discussed the procedure including the risks, benefits and alternatives for the proposed anesthesia with the patient or authorized representative who has indicated his/her understanding and acceptance.     Dental advisory given  Plan Discussed with: CRNA  Anesthesia Plan Comments:        Anesthesia Quick Evaluation

## 2023-08-22 NOTE — Transfer of Care (Signed)
 Immediate Anesthesia Transfer of Care Note  Patient: Casey Harper  Procedure(s) Performed: RIGHT BUNION CORRECTION WITH DOUBLE OSTEOTOMY (Right: Foot)  Patient Location: PACU  Anesthesia Type:GA combined with regional for post-op pain  Level of Consciousness: drowsy and patient cooperative  Airway & Oxygen Therapy: Patient Spontanous Breathing and Patient connected to face mask oxygen  Post-op Assessment: Report given to RN and Post -op Vital signs reviewed and stable  Post vital signs: Reviewed and stable  Last Vitals:  Vitals Value Taken Time  BP    Temp    Pulse 63 08/22/23 0846  Resp    SpO2 100 % 08/22/23 0846  Vitals shown include unfiled device data.  Last Pain:  Vitals:   08/22/23 0707  TempSrc:   PainSc: 0-No pain      Patients Stated Pain Goal: 3 (08/22/23 1610)  Complications: No notable events documented.

## 2023-08-23 ENCOUNTER — Encounter (HOSPITAL_BASED_OUTPATIENT_CLINIC_OR_DEPARTMENT_OTHER): Payer: Self-pay | Admitting: Orthopedic Surgery
